# Patient Record
Sex: Female | Born: 1970 | Race: White | Hispanic: No | Marital: Married | Smoking: Never smoker
Health system: Southern US, Community
[De-identification: ages and names within clinical notes are randomized; demographics above are authoritative.]

## PROBLEM LIST (undated history)

## (undated) DIAGNOSIS — R42 Dizziness and giddiness: Secondary | ICD-10-CM

## (undated) DIAGNOSIS — I10 Essential (primary) hypertension: Secondary | ICD-10-CM

## (undated) HISTORY — PX: TONSILLECTOMY: SUR1361

## (undated) HISTORY — PX: ABDOMINAL HYSTERECTOMY: SHX81

## (undated) HISTORY — PX: CARPAL TUNNEL RELEASE: SHX101

## (undated) HISTORY — PX: CHOLECYSTECTOMY: SHX55

---

## 2005-11-09 ENCOUNTER — Ambulatory Visit (HOSPITAL_BASED_OUTPATIENT_CLINIC_OR_DEPARTMENT_OTHER): Admission: RE | Admit: 2005-11-09 | Discharge: 2005-11-09 | Payer: Self-pay | Admitting: Orthopedic Surgery

## 2006-03-21 ENCOUNTER — Ambulatory Visit (HOSPITAL_BASED_OUTPATIENT_CLINIC_OR_DEPARTMENT_OTHER): Admission: RE | Admit: 2006-03-21 | Discharge: 2006-03-21 | Payer: Self-pay | Admitting: Orthopedic Surgery

## 2007-02-15 ENCOUNTER — Emergency Department (HOSPITAL_COMMUNITY): Admission: EM | Admit: 2007-02-15 | Discharge: 2007-02-15 | Payer: Self-pay | Admitting: Emergency Medicine

## 2008-10-14 IMAGING — CR DG CHEST 2V
2 series · 2 of 2 positions shown · non-contrast
Comparison: No prior studies are available for comparison.

CLINICAL DATA: Chest pain. 
 CHEST - 2 VIEW:

[w chest pa]
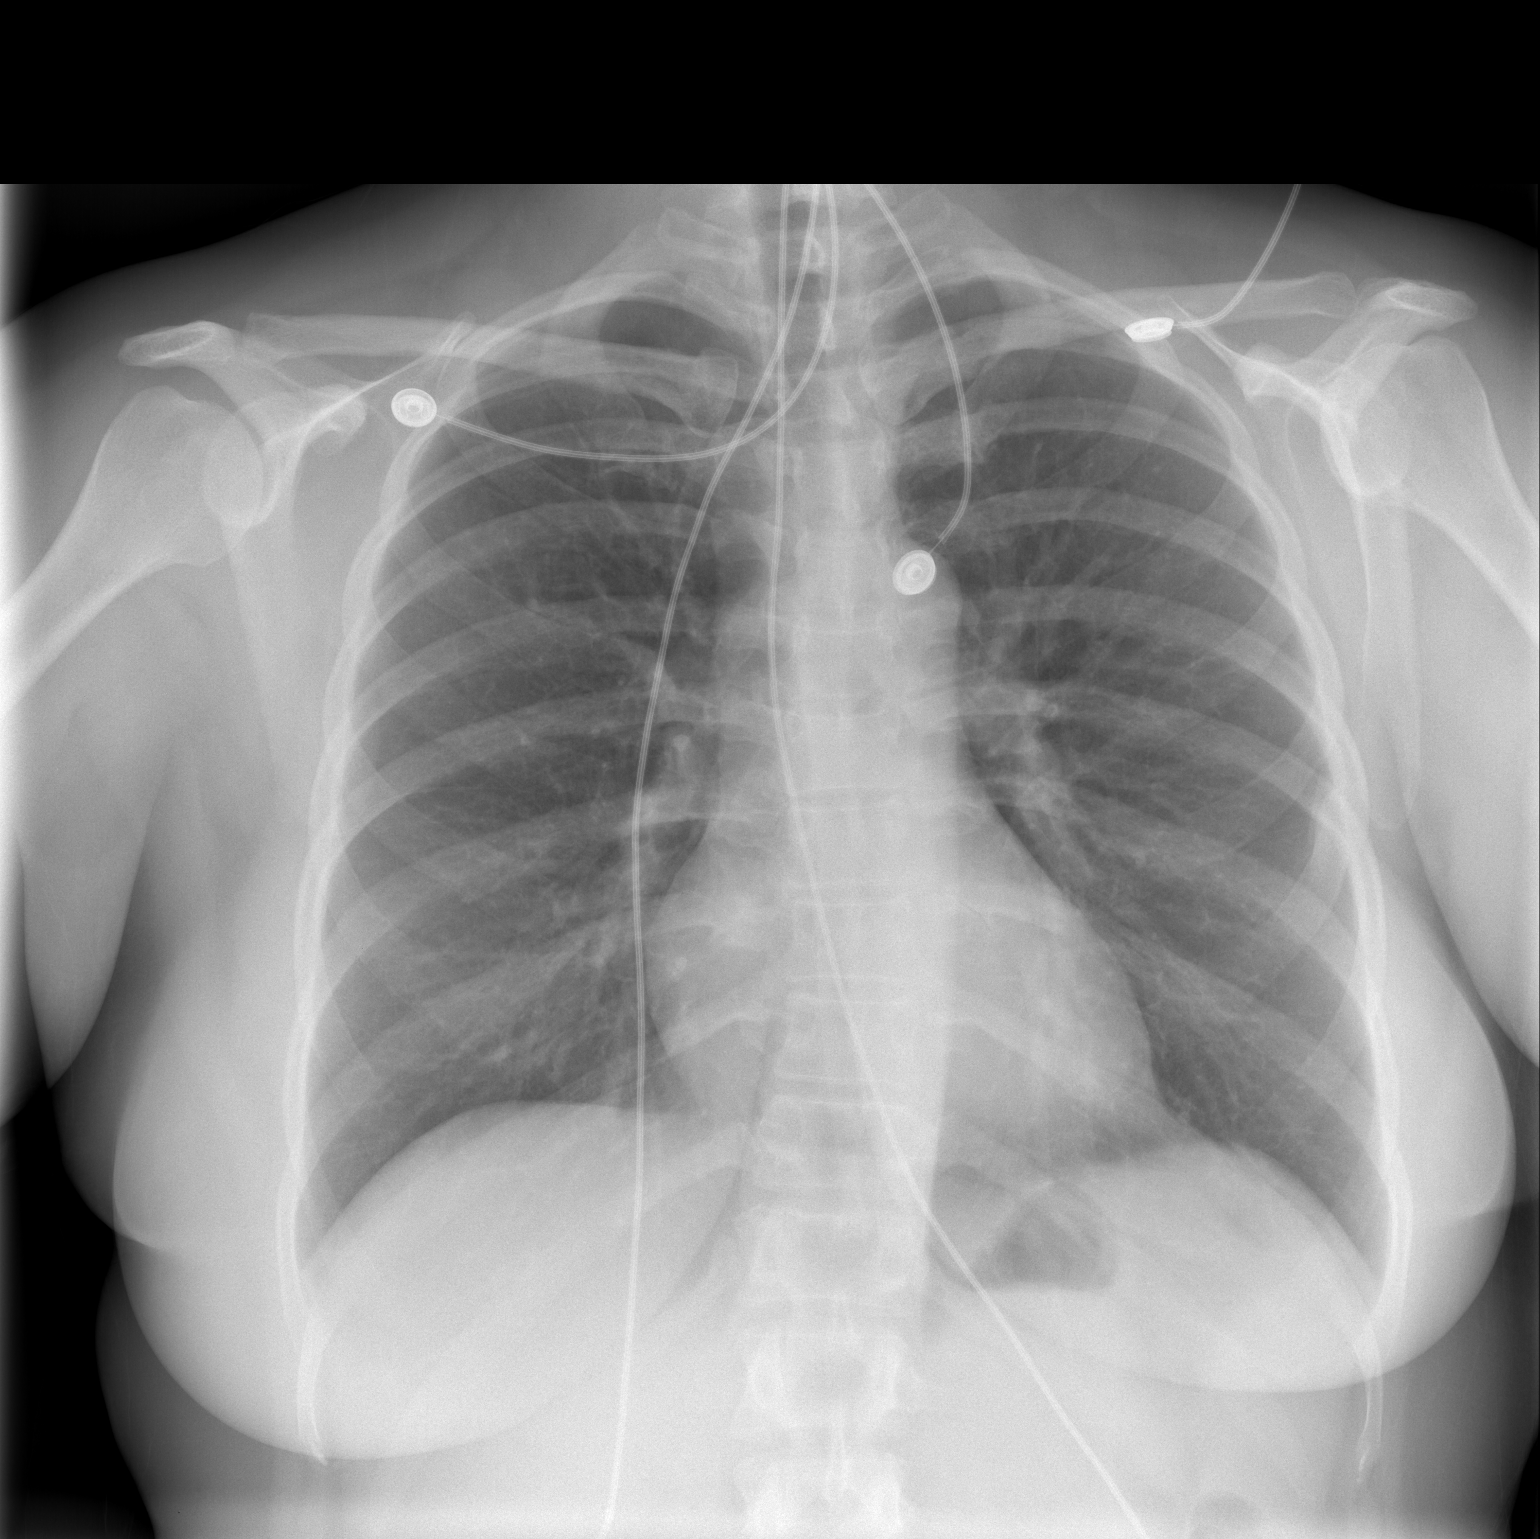

[w chest lat]
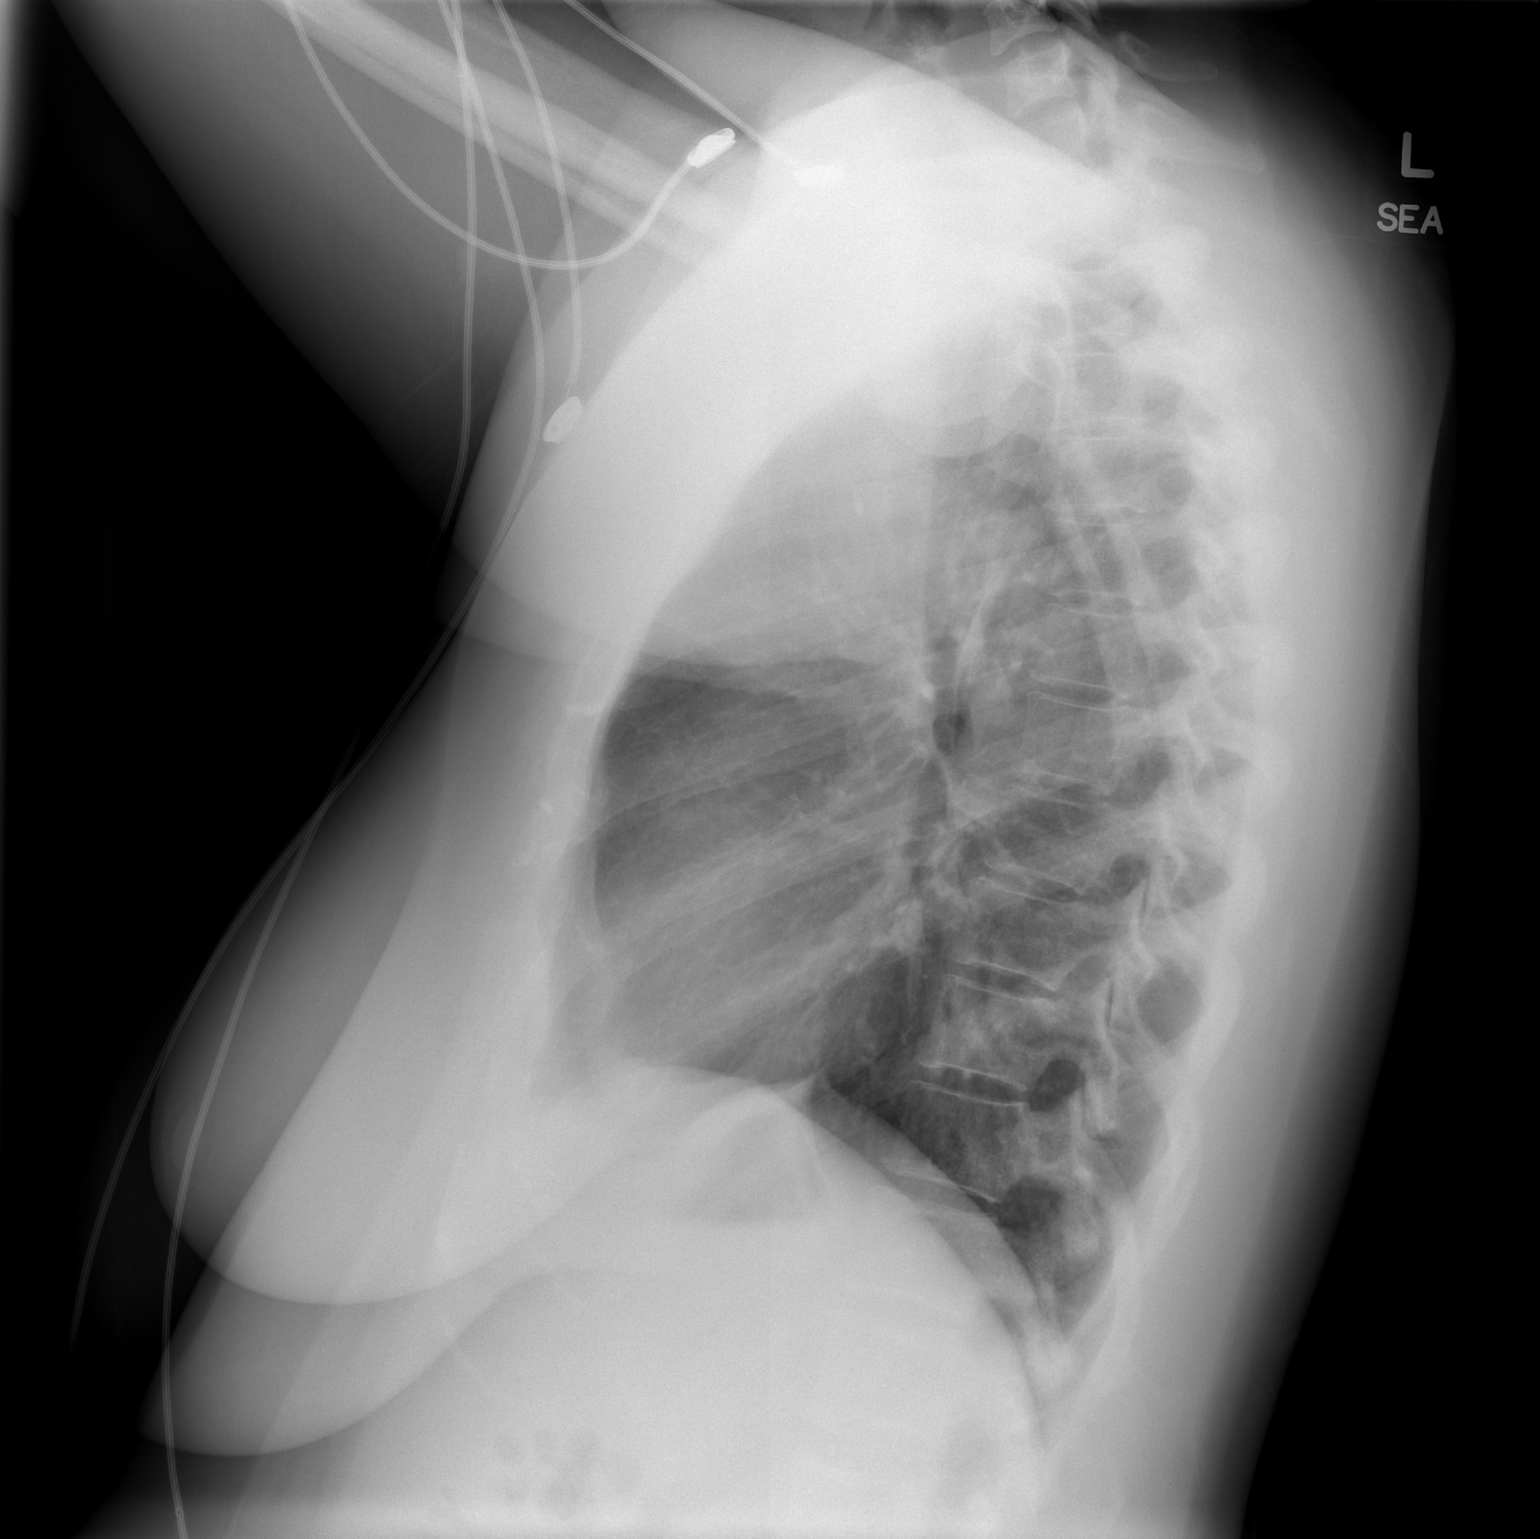

[2 of 2 positions shown; findings below may reference images not displayed]

FINDINGS: The lungs are clear. Remote posterior left sixth rib fracture. Cardiac and mediastinal contours normal. Osseous structures unremarkable.
IMPRESSION: Negative for acute cardiac or pulmonary process.

## 2010-08-18 NOTE — Op Note (Signed)
NAME:  Melanie Shaffer, Melanie Shaffer                ACCOUNT NO.:  [id number]   MEDICAL RECORD NO.:  [id number]          PATIENT TYPE:  AMB   LOCATION:  DSC                          FACILITY:  [facility]   PHYSICIAN:  Katy Fitch. Sypher, M.D. 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   POSTOPERATIVE DIAGNOSIS:  Chronic entrapment neuropathy left median  nerve at carpal tunnel.   OPERATION:  Release of left transverse carpal ligament.   OPERATING SURGEON:  Josephine Igo, M.D.   ASSISTANT:  Molly Maduro Dasnoit PA-C.   ANESTHESIA:  General by LMA.   SUPERVISING ANESTHESIOLOGIST:  Quita Skye. Krista Blue, M.D.   INDICATIONS:  [name] is a 40 year old woman referred for  evaluation and management of bilateral hand numbness.   Clinical examination revealed signs of bilateral carpal tunnel syndrome.  Electrodiagnostic confirmed significant bilateral carpal tunnel  syndrome.   Due to a failure to respond to nonoperative measures, she is brought to  the operating at this time for release of her left transverse carpal  ligament.   PROCEDURE:  Karlyn Glasco is brought to the operating room and placed in  the supine position upon the operating table.   Following the induction of general anesthesia by LMA, the left arm was  prepped with Betadine soap and solution and sterilely draped.  Following  exsanguination of the left arm with Esmarch bandage, the arterial  tourniquet on the proximal brachium is inflated to 220 mmHg.   Procedure commenced with a short incision in the line of the ring finger  and the palm.  Subcutaneous tissues were carefully divided revealing the  palmar fascia.  This was split longitudinally to reveal the common  sensory branch of the nerve.  These were followed back to the transverse  carpal ligament which was  gently isolated from median nerve.  The  ligament was then released along its ulnar border extending into the  distal forearm.  This widely opened the carpal canal.  No masses or  other predicaments were noted.   Bleeding points along the margin of the released ligament were  cauterized with bipolar current followed by repair of the skin with  intradermal 3-0 Prolene suture.   A compressive dressing was applied with a volar plaster splint  maintaining the wrist in 5 degrees of dorsiflexion.   There were no apparent complications.   Ms. Madonia tolerated surgery and anesthesia well.  She is transferred to  the recovery room with stable vital signs.     For aftercare, she is provided a prescription for Percocet 5 mg one  p.o. q.4-6h. p.r.n. pain 20 tablets without refill.      Katy Fitch Sypher, M.D.  Electronically Signed     RVS/MEDQ  D:  03/21/2006  T:  03/22/2006  Job:  [id number]

## 2010-08-18 NOTE — Op Note (Signed)
NAME:  Melanie Shaffer, Melanie Shaffer                ACCOUNT NO.:  [id number]   MEDICAL RECORD NO.:  [id number]          PATIENT TYPE:  AMB   LOCATION:  DSC                          FACILITY:  [facility]   PHYSICIAN:  Katy Fitch. Sypher, M.D. DATE OF BIRTH:  [birth year]   DATE OF PROCEDURE:  11/09/2005  DATE OF DISCHARGE:                                 OPERATIVE REPORT   PREOPERATIVE DIAGNOSIS:  Chronic right median entrapment neuropathy at  carpal tunnel.   POSTOPERATIVE DIAGNOSIS:  Chronic right median entrapment neuropathy at  carpal tunnel.   OPERATIONS:  Release of right transverse carpal ligament.   OPERATING SURGEON:  Josephine Igo, M.D.   ASSISTANT:  Annye Rusk, P.A.-C.   ANESTHESIA:  Is general by LMA; supervising anesthesiologist is Dr.  Gelene Mink.   INDICATIONS:  [name] is a 40 year old woman referred for evaluation  and management of hand numbness and pain.  Clinical examination revealed  signs of carpal tunnel syndrome.  Electrodiagnostic studies confirmed  significant median neuropathy.   Due to a failure to respond to nonoperative measures, she is brought to the  operating room at this time for release of her right transverse carpal  ligament.   PROCEDURE:  [name] was brought to the operating room and placed in  supine position on the operating table.   Following the induction general anesthesia by LMA technique, the right arm  was prepped with Betadine soap solution and sterilely draped.  On  examination of the limb with Esmarch bandage, arterial tourniquet inflated  to 220 mmHg.   Procedure commenced with a short incision in line of the ring finger into  the palm.  Subcutaneous tissue was carefully divided revealing the palmar  fascia.  This was split longitudinally to reveal the ___________  branch of  the median nerve.  This was followed back to transverse carpal ligament  which was gently isolated from the median nerve.  Ligament was released with  scissors  along its ulnar border, extending into the distal forearm.   This widely opened the carpal canal.  No mass or other predicaments were  noted.   Bleeding points along the margin of the released ligament were  electrocauterized with bipolar current followed by repair of the skin with  intradermal 3-0 Prolene suture.   A compressive dressing was applied with a volar plaster splint maintaining  the wrist in 5-degrees of dorsiflexion.      Katy Fitch Sypher, M.D.  Electronically Signed     RVS/MEDQ  D:  11/09/2005  T:  11/09/2005  Job:  [id number]

## 2011-01-09 LAB — DIFFERENTIAL
Basophils Absolute: 0
Basophils Relative: 1
Lymphocytes Relative: 27
Monocytes Absolute: 0.5
Monocytes Relative: 7

## 2011-01-09 LAB — CBC
MCHC: 35.2
RDW: 12.4
WBC: 6.5

## 2011-01-09 LAB — BASIC METABOLIC PANEL
BUN: 10
CO2: 31
Calcium: 9.3
Chloride: 105
Creatinine, Ser: 0.69
GFR calc Af Amer: >60
Sodium: 140

## 2011-01-09 LAB — POCT CARDIAC MARKERS
CKMB, poc: <1 — ABNORMAL LOW
Myoglobin, poc: 54
Operator id: 1211

## 2011-07-12 ENCOUNTER — Ambulatory Visit: Payer: Self-pay | Admitting: Internal Medicine

## 2011-08-23 ENCOUNTER — Ambulatory Visit: Payer: Self-pay | Admitting: Internal Medicine

## 2011-08-23 DIAGNOSIS — Z0289 Encounter for other administrative examinations: Secondary | ICD-10-CM

## 2012-03-05 ENCOUNTER — Emergency Department (HOSPITAL_COMMUNITY): Payer: BC Managed Care – PPO

## 2012-03-05 ENCOUNTER — Emergency Department (HOSPITAL_COMMUNITY)
Admission: EM | Admit: 2012-03-05 | Discharge: 2012-03-05 | Disposition: A | Discharge status: Home / Self Care | Payer: BC Managed Care – PPO | Attending: Emergency Medicine | Admitting: Emergency Medicine

## 2012-03-05 ENCOUNTER — Encounter (HOSPITAL_COMMUNITY): Payer: Self-pay | Admitting: Emergency Medicine

## 2012-03-05 DIAGNOSIS — I10 Essential (primary) hypertension: Secondary | ICD-10-CM | POA: Insufficient documentation

## 2012-03-05 DIAGNOSIS — Z7982 Long term (current) use of aspirin: Secondary | ICD-10-CM | POA: Insufficient documentation

## 2012-03-05 DIAGNOSIS — Z79899 Other long term (current) drug therapy: Secondary | ICD-10-CM | POA: Insufficient documentation

## 2012-03-05 DIAGNOSIS — H8110 Benign paroxysmal vertigo, unspecified ear: Secondary | ICD-10-CM | POA: Insufficient documentation

## 2012-03-05 DIAGNOSIS — R079 Chest pain, unspecified: Secondary | ICD-10-CM | POA: Insufficient documentation

## 2012-03-05 DIAGNOSIS — H9319 Tinnitus, unspecified ear: Secondary | ICD-10-CM | POA: Insufficient documentation

## 2012-03-05 DIAGNOSIS — R42 Dizziness and giddiness: Secondary | ICD-10-CM

## 2012-03-05 HISTORY — DX: Essential (primary) hypertension: I10

## 2012-03-05 LAB — CBC
HCT: 39 % (ref 36.0–46.0)
Hemoglobin: 13.2 g/dL (ref 12.0–15.0)
MCH: 28.7 pg (ref 26.0–34.0)
MCV: 84.8 fL (ref 78.0–100.0)
Platelets: 177 10*3/uL (ref 150–400)
RBC: 4.6 MIL/uL (ref 3.87–5.11)
RDW: 12 % (ref 11.5–15.5)
WBC: 10 10*3/uL (ref 4.0–10.5)

## 2012-03-05 LAB — BASIC METABOLIC PANEL
Calcium: 9.3 mg/dL (ref 8.4–10.5)
GFR calc Af Amer: >90 mL/min (ref >90)
Glucose, Bld: 106 mg/dL — ABNORMAL HIGH (ref 70–99)
Sodium: 134 mEq/L — ABNORMAL LOW (ref 135–145)

## 2012-03-05 MED ORDER — ONDANSETRON HCL 4 MG/2ML IJ SOLN
4.0000 mg | Freq: Once | INTRAMUSCULAR | Status: AC
  Administered 2012-03-05: 4 mg via INTRAVENOUS
  Filled 2012-03-05: qty 2

## 2012-03-05 MED ORDER — MECLIZINE HCL 50 MG PO TABS: 25.0000 mg | ORAL_TABLET | Freq: Three times a day (TID) | ORAL | Status: AC | PRN

## 2012-03-05 MED ORDER — METOCLOPRAMIDE HCL 5 MG/ML IJ SOLN
10.0000 mg | Freq: Once | INTRAMUSCULAR | Status: AC
  Administered 2012-03-05: 10 mg via INTRAVENOUS
  Filled 2012-03-05: qty 2

## 2012-03-05 MED ORDER — ONDANSETRON 8 MG PO TBDP: 8.0000 mg | ORAL_TABLET | Freq: Three times a day (TID) | ORAL | Status: AC | PRN

## 2012-03-05 MED ORDER — MECLIZINE HCL 25 MG PO TABS
50.0000 mg | ORAL_TABLET | Freq: Once | ORAL | Status: AC
  Administered 2012-03-05: 50 mg via ORAL
  Filled 2012-03-05: qty 2

## 2012-03-05 NOTE — ED Notes (Signed)
Pt states that she has been dizzy, nauseated and having "chest tightness" radiates to her back that comes and goes for couple of weeks. Pt states the chest tightness 3/10.

## 2012-03-05 NOTE — ED Notes (Signed)
Pt c/o feeling nauseated

## 2012-03-05 NOTE — ED Provider Notes (Signed)
History     CSN: [id number]  Arrival date & time 03/05/12  4098   First MD Initiated Contact with Patient 03/05/12 [phone]      Chief Complaint  Patient presents with  . Nausea  . Dizziness  . Chest Pain    (Consider location/radiation/quality/duration/timing/severity/associated sxs/prior treatment) HPI Comments: Pt comes in with cc of nausea, dizziness -described as vertigo. The vertigo is intermittent, and ranges between severe and moderate. She has some associated ringing in the ears and associated nausea, but no emesis. Symptoms are worse with certain positions, especially turning her head. Pt denies any other neurologic complains. No hx of brain cancer, aneurysms. No hx of vertigo. Chest pain - pt states that she had some pain earlier today, it was upper thoracic, and lasted only for few minutes. No cardiac hx.  Patient is a 41 y.o. female presenting with chest pain. The history is provided by the patient.  Chest Pain Pertinent negatives for primary symptoms include no shortness of breath, no palpitations, no abdominal pain, no nausea and no vomiting.     Past Medical History  Diagnosis Date  . Hypertension     Past Surgical History  Procedure Date  . Abdominal hysterectomy   . Tonsillectomy   . Cholecystectomy   . Carpal tunnel release     No family history on file.  History  Substance Use Topics  . Smoking status: Never Smoker   . Smokeless tobacco: Never Used  . Alcohol Use: No    OB History    Grav Para Term Preterm Abortions TAB SAB Ect Mult Living                  Review of Systems  Constitutional: Positive for activity change.  HENT: Positive for tinnitus. Negative for hearing loss, ear pain and neck pain.   Respiratory: Negative for shortness of breath.   Cardiovascular: Positive for chest pain. Negative for palpitations.  Gastrointestinal: Negative for nausea, vomiting and abdominal pain.  Genitourinary: Negative for dysuria.  Neurological:  Positive for light-headedness. Negative for headaches.    Allergies  Hydrocodone  Home Medications   Current Outpatient Rx  Name  Route  Sig  Dispense  Refill  . ASPIRIN 325 MG PO TABS   Oral   Take 325 mg by mouth daily.         Marland Kitchen VITAMIN B 12 PO   Oral   Take 1 tablet by mouth daily.         Marland Kitchen MECLIZINE HCL 50 MG PO TABS   Oral   Take 0.5 tablets (25 mg total) by mouth 3 (three) times daily as needed for dizziness.   30 tablet   0   . ONDANSETRON 8 MG PO TBDP   Oral   Take 1 tablet (8 mg total) by mouth every 8 (eight) hours as needed for nausea.   20 tablet   0     BP 148/81  Pulse 80  Temp 97.5 F (36.4 C) (Oral)  Resp 20  SpO2 99%  Physical Exam  Nursing note and vitals reviewed. Constitutional: She is oriented to person, place, and time. She appears well-developed.  HENT:  Head: Normocephalic and atraumatic.  Right Ear: External ear normal.  Left Ear: External ear normal.       When trying to attemp the dix halpike  - patient's sx got worse just with laying down. No nystagmus at any point.  Eyes: Conjunctivae normal and EOM are normal. Pupils are  equal, round, and reactive to light.       No nystagmus  Neck: Normal range of motion. Neck supple.  Cardiovascular: Normal rate, regular rhythm and normal heart sounds.   Pulmonary/Chest: Effort normal and breath sounds normal. No respiratory distress.  Abdominal: Soft. Bowel sounds are normal. She exhibits no distension. There is no tenderness. There is no rebound and no guarding.  Neurological: She is alert and oriented to person, place, and time.       Cerebellar exam is normal (finger to nose) Sensory exam normal for bilateral upper and lower extremities - and patient is able to discriminate between sharp and dull. Motor exam is 4+/5  Skin: Skin is warm and dry.    ED Course  Procedures (including critical care time)  Labs Reviewed  BASIC METABOLIC PANEL - Abnormal; Notable for the following:     Sodium 134 (*)     Glucose, Bld 106 (*)     All other components within normal limits  CBC  PRO B NATRIURETIC PEPTIDE  POCT I-STAT TROPONIN I   Dg Chest Port 1 View  03/05/2012  *RADIOLOGY REPORT*  Clinical Data: Nausea, dizziness, and chest pain  PORTABLE CHEST - 1 VIEW  Comparison: Chest radiograph 02/15/1999 at  Findings: The cardiac leads project over the chest.  The heart, mediastinal, hilar contours within normal limits.  The trachea is midline.  The lungs are clear.  No acute bony abnormality.  IMPRESSION: No acute cardiopulmonary disease.   Original Report Authenticated By: Britta Mccreedy, M.D.      1. BPPV (benign paroxysmal positional vertigo)   2. Vertigo       MDM  DDx includes:  Peripheral Vertigo:  BPPV  Vestibular neuritis  Meniere disease  Migrainous vertigo  Ear Infection    Date: 03/05/2012  Rate: 77  Rhythm: normal sinus rhythm  QRS Axis: normal  Intervals: normal  ST/T Wave abnormalities: normal  Conduction Disutrbances: none  Narrative Interpretation: unremarkable  Pt comes in with cc of vertigo. Pt's hx and exam suggestive of peripheral cause. She is having bilateral ringing in the ear - but her exam is normal. We will give her meclizine, reglan and hydrate. Basic labs and troponin ordered as well.  If the workup is normal, ENT f/u will be provided. Meniere's is a possible etiology.     Derwood Kaplan, MD 03/05/12 [phone]

## 2012-03-05 NOTE — ED Notes (Signed)
Pt c/o of her ears being "stopped up feeling". Pt denies any pain.

## 2012-03-05 NOTE — ED Notes (Signed)
MDP Melanie Shaffer made aware of pt nauseated.

## 2012-03-05 NOTE — ED Notes (Addendum)
Pt c/o of "feeling weird, and very agitated. States she getting hot. EDP Nanavati came in room and advised of pt's complaint. Verbally advised to run NS via IV.

## 2013-11-02 IMAGING — CR DG CHEST 1V PORT
1 series · 1 of 1 positions shown · non-contrast
Comparison: Chest radiograph 02/15/1999 at

CLINICAL DATA: Nausea, dizziness, and chest pain

PORTABLE CHEST - 1 VIEW

[AP]
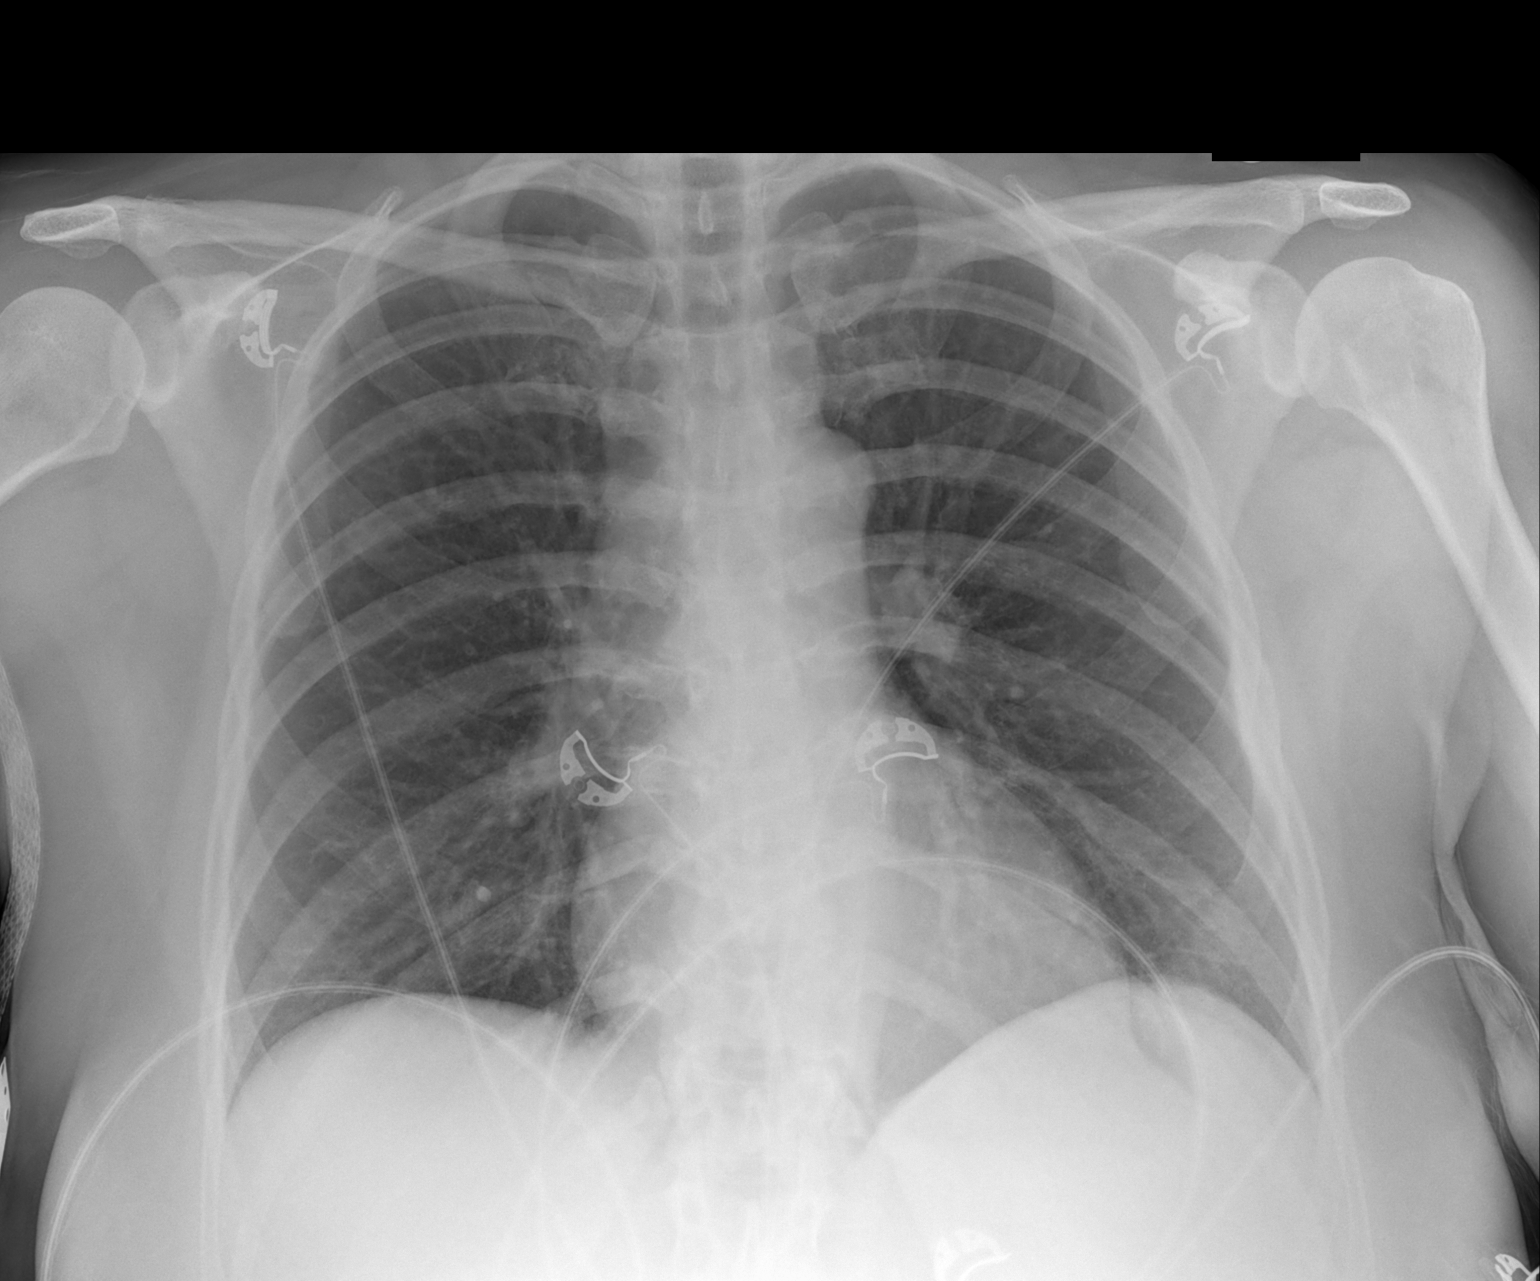

[1 of 1 positions shown; findings below may reference images not displayed]

FINDINGS: The cardiac leads project over the chest.  The heart,
mediastinal, hilar contours within normal limits.  The trachea is
midline.  The lungs are clear.  No acute bony abnormality.
IMPRESSION: No acute cardiopulmonary disease.

## 2013-12-26 DIAGNOSIS — K219 Gastro-esophageal reflux disease without esophagitis: Secondary | ICD-10-CM | POA: Insufficient documentation

## 2013-12-26 DIAGNOSIS — J309 Allergic rhinitis, unspecified: Secondary | ICD-10-CM | POA: Insufficient documentation

## 2013-12-26 DIAGNOSIS — B9689 Other specified bacterial agents as the cause of diseases classified elsewhere: Secondary | ICD-10-CM | POA: Insufficient documentation

## 2013-12-26 DIAGNOSIS — R079 Chest pain, unspecified: Secondary | ICD-10-CM | POA: Insufficient documentation

## 2013-12-26 DIAGNOSIS — I10 Essential (primary) hypertension: Secondary | ICD-10-CM | POA: Insufficient documentation

## 2018-08-29 DIAGNOSIS — M25562 Pain in left knee: Secondary | ICD-10-CM | POA: Insufficient documentation

## 2019-03-16 ENCOUNTER — Other Ambulatory Visit: Payer: Self-pay

## 2019-03-16 NOTE — Telephone Encounter (Signed)
NOTES ON FILE FROM DR Cecille Amsterdam [phone] SENT REFERRAL TO [facility]

## 2019-08-25 ENCOUNTER — Emergency Department (HOSPITAL_COMMUNITY): Payer: Commercial Managed Care - PPO

## 2019-08-25 ENCOUNTER — Encounter (HOSPITAL_COMMUNITY): Payer: Self-pay

## 2019-08-25 ENCOUNTER — Other Ambulatory Visit: Payer: Self-pay

## 2019-08-25 ENCOUNTER — Emergency Department (HOSPITAL_COMMUNITY)
Admission: EM | Admit: 2019-08-25 | Discharge: 2019-08-25 | Disposition: A | Discharge status: Home / Self Care | Payer: Commercial Managed Care - PPO | Attending: Emergency Medicine | Admitting: Emergency Medicine

## 2019-08-25 DIAGNOSIS — Z7982 Long term (current) use of aspirin: Secondary | ICD-10-CM | POA: Diagnosis not present

## 2019-08-25 DIAGNOSIS — Z79899 Other long term (current) drug therapy: Secondary | ICD-10-CM | POA: Insufficient documentation

## 2019-08-25 DIAGNOSIS — R42 Dizziness and giddiness: Secondary | ICD-10-CM | POA: Diagnosis not present

## 2019-08-25 DIAGNOSIS — R0789 Other chest pain: Secondary | ICD-10-CM | POA: Diagnosis present

## 2019-08-25 DIAGNOSIS — M79662 Pain in left lower leg: Secondary | ICD-10-CM | POA: Diagnosis not present

## 2019-08-25 DIAGNOSIS — R002 Palpitations: Secondary | ICD-10-CM | POA: Insufficient documentation

## 2019-08-25 HISTORY — DX: Dizziness and giddiness: R42

## 2019-08-25 LAB — CBC
HCT: 41 % (ref 36.0–46.0)
Hemoglobin: 13.1 g/dL (ref 12.0–15.0)
MCH: 28.2 pg (ref 26.0–34.0)
MCHC: 32 g/dL (ref 30.0–36.0)
MCV: 88.2 fL (ref 80.0–100.0)
Platelets: 182 10*3/uL (ref 150–400)
RBC: 4.65 MIL/uL (ref 3.87–5.11)
RDW: 12.7 % (ref 11.5–15.5)
WBC: 6.6 10*3/uL (ref 4.0–10.5)
nRBC: 0 % (ref 0.0–0.2)

## 2019-08-25 LAB — BASIC METABOLIC PANEL
Anion gap: 11 (ref 5–15)
BUN: 14 mg/dL (ref 6–20)
CO2: 25 mmol/L (ref 22–32)
Calcium: 9.4 mg/dL (ref 8.9–10.3)
Chloride: 105 mmol/L (ref 98–111)
Creatinine, Ser: 0.84 mg/dL (ref 0.44–1.00)
GFR calc Af Amer: >60 mL/min (ref >60)
GFR calc non Af Amer: >60 mL/min (ref >60)
Glucose, Bld: 139 mg/dL — ABNORMAL HIGH (ref 70–99)
Potassium: 3.7 mmol/L (ref 3.5–5.1)
Sodium: 141 mmol/L (ref 135–145)

## 2019-08-25 LAB — TROPONIN I (HIGH SENSITIVITY): Troponin I (High Sensitivity): 3 ng/L (ref <18)

## 2019-08-25 NOTE — ED Provider Notes (Signed)
[facility] EMERGENCY DEPARTMENT Provider Note   CSN: [id number] Arrival date & time: 08/25/19  4431     History Chief Complaint  Patient presents with  . Chest Pain  . Dizziness    Melanie Shaffer is a 49 y.o. female.  Melanie Shaffer is a 49 year old female with a history of vertigo for which she takes meclizine and high blood pressure for which she takes atenolol --presents today by EMS after an episode of vertigo, palpitations, and chest tightness.  Patient states that she awoke this morning with her typical vertigo symptoms described as a spinning sensation.  She states that she got up and went to the kitchen.  Her husband at bedside states that this episode was worse than normal.  Patient states that she became very anxious and her heart started to race.  This was associated with chest tightness.  She also had a headache.  EMS was called.  Patient took aspirin prior to arrival.  By EMS report, blood pressure was initially 228/120.  Patient did not take her atenolol this morning.  She was transported to the hospital and began to feel better.  She was waiting in the waiting room with minimal symptoms.  Currently she only reports mild dizziness at this time.  Chest tightness only occurred with heart racing.  Patient denies signs of stroke including: facial droop, slurred speech, aphasia, weakness/numbness in extremities, imbalance/trouble walking.  Patient denies risk factors for pulmonary embolism including: unilateral leg swelling, history of DVT/PE/other blood clots, use of exogenous hormones, recent immobilizations, recent surgery, malignancy, hemoptysis.  Patient does report a 4-hour car ride last week during which they took 1 short break.  Patient states that she went up to use the bathroom in the waiting room of the emergency department and noted that she had some left calf cramping and tenderness.  This was not present prior to ED arrival.         Past Medical History:   Diagnosis Date  . Hypertension   . Vertigo     There are no problems to display for this patient.   Past Surgical History:  Procedure Laterality Date  . ABDOMINAL HYSTERECTOMY    . CARPAL TUNNEL RELEASE    . CHOLECYSTECTOMY    . TONSILLECTOMY       OB History   No obstetric history on file.     No family history on file.  Social History   Tobacco Use  . Smoking status: Never Smoker  . Smokeless tobacco: Never Used  Substance Use Topics  . Alcohol use: No  . Drug use: No    Home Medications Prior to Admission medications   Medication Sig Start Date End Date Taking? Authorizing Provider  aspirin 325 MG tablet Take 325 mg by mouth daily.    [provider]  Cyanocobalamin (VITAMIN B 12 PO) Take 1 tablet by mouth daily.    [provider]  meclizine (ANTIVERT) 50 MG tablet Take 0.5 tablets (25 mg total) by mouth 3 (three) times daily as needed for dizziness. 03/05/12   Varney Biles, MD  ondansetron (ZOFRAN ODT) 8 MG disintegrating tablet Take 1 tablet (8 mg total) by mouth every 8 (eight) hours as needed for nausea. 03/05/12   Varney Biles, MD    Allergies    Hydrocodone  Review of Systems   Review of Systems  Constitutional: Negative for diaphoresis and fever.  Eyes: Negative for redness.  Respiratory: Positive for chest tightness. Negative for cough and  shortness of breath.   Cardiovascular: Positive for chest pain. Negative for palpitations and leg swelling.  Gastrointestinal: Negative for abdominal pain, nausea and vomiting.  Genitourinary: Negative for dysuria.  Musculoskeletal: Positive for myalgias. Negative for back pain and neck pain.  Skin: Negative for rash.  Neurological: Positive for dizziness and headaches. Negative for syncope, facial asymmetry, weakness, light-headedness and numbness.  Psychiatric/Behavioral: The patient is not nervous/anxious.     Physical Exam Updated Vital Signs BP (!) 144/93 (BP Location: Left Arm)    Pulse 70   Temp 98.2 F (36.8 C) (Oral)   Resp 20   SpO2 99%   Physical Exam Vitals and nursing note reviewed.  Constitutional:      Appearance: She is well-developed.  HENT:     Head: Normocephalic and atraumatic.     Right Ear: Tympanic membrane, ear canal and external ear normal.     Left Ear: Tympanic membrane, ear canal and external ear normal.     Nose: Nose normal.     Mouth/Throat:     Pharynx: Uvula midline.  Eyes:     General: Lids are normal.        Right eye: No discharge.        Left eye: No discharge.     Extraocular Movements:     Right eye: No nystagmus.     Left eye: No nystagmus.     Conjunctiva/sclera: Conjunctivae normal.     Pupils: Pupils are equal, round, and reactive to light.  Cardiovascular:     Rate and Rhythm: Normal rate and regular rhythm.     Heart sounds: Normal heart sounds.  Pulmonary:     Effort: Pulmonary effort is normal.     Breath sounds: Normal breath sounds.  Abdominal:     Palpations: Abdomen is soft.     Tenderness: There is no abdominal tenderness.  Musculoskeletal:     Cervical back: Normal range of motion and neck supple. No tenderness or bony tenderness.     Right lower leg: No tenderness. No edema.     Left lower leg: Tenderness present. No edema.     Comments: Patient reports some mild left calf tenderness which started after ED arrival.  She does not have any gross edema, erythema.  Normal 2+ DP pulses bilaterally.  No clinical signs of DVT.  Skin:    General: Skin is warm and dry.  Neurological:     Mental Status: She is alert and oriented to person, place, and time.     GCS: GCS eye subscore is 4. GCS verbal subscore is 5. GCS motor subscore is 6.     Cranial Nerves: No cranial nerve deficit.     Sensory: No sensory deficit.     Coordination: Coordination normal.     Gait: Gait normal.     ED Results / Procedures / Treatments   Labs (all labs ordered are listed, but only abnormal results are displayed) Labs  Reviewed  BASIC METABOLIC PANEL - Abnormal; Notable for the following components:      Result Value   Glucose, Bld 139 (*)    All other components within normal limits  CBC  TROPONIN I (HIGH SENSITIVITY)  TROPONIN I (HIGH SENSITIVITY)   ED ECG REPORT   Date: 08/25/2019  Rate: 87  Rhythm: normal sinus rhythm  QRS Axis: normal  Intervals: normal  ST/T Wave abnormalities: normal  Conduction Disutrbances:none  Narrative Interpretation: no significant ST changes noted  Old EKG Reviewed: unchanged from  2013  I have personally reviewed the EKG tracing and disagree with the computerized printout as noted.  Radiology DG Chest 2 View  Result Date: 08/25/2019 CLINICAL DATA:  dizziness and chest pain/tightness that started this morning when she woke up, pain is intermittent in the center of her chest. Pt also having a headache that radiates down her neck. EXAM: CHEST - 2 VIEW COMPARISON:  03/12/2019 no FINDINGS: The heart size and mediastinal contours are within normal limits. Both lungs are clear. Pleural effusion or pneumothorax. The visualized skeletal structures are unremarkable. IMPRESSION: No active cardiopulmonary disease. Electronically Signed   By: Amie Portland M.D.   On: 08/25/2019 08:33    Procedures Procedures (including critical care time)  Medications Ordered in ED Medications - No data to display  ED Course  I have reviewed the triage vital signs and the nursing notes.  Pertinent labs & imaging results that were available during my care of the patient were reviewed by me and considered in my medical decision making (see chart for details).  Patient seen and examined. Work-up reviewed.  Upon arrival to the exam room, patient has been waiting in the emergency department for greater than 6 and half hours.  History and physical performed.  Additional history per husband at bedside who is very helpful.  At this point, patient has minimal symptoms.  I discussed further work-up  that is recommended at this time.  This includes second troponin and EKG.  We discussed that she came in very shortly after her symptoms began and the first troponin may not be elevated if she had a cardiac event.  Discussed that this would likely take 45 minutes to an hour after blood draw to result.  Unfortunately, this was not performed while the patient was in the waiting room.  Patient also has some left calf tenderness.  Overall, discussed with patient and husband, that her exam and history is not concerning for PE or DVT.  She does have recent travel.  I would have expected the patient symptoms be persistent, for her to have tachycardia (no beta-blocker this morning), and hypoxia.  Her symptoms seem to have resolved which makes PE less likely.  I have discussed and offered ultrasound to rule out DVT.  Patient verbalizes that she is feeling a lot better.  She was actually thinking about leaving while in the waiting room.  At this point she declines any further testing.  She seems to understand the benefits of additional testing and risks of missing potential cardiac event/blood clot -- although admittedly I feel the chance of her having 1 of these things is very low.  Patient does state that she is willing to return to the emergency department if she has any recurrent or persistent symptoms, symptoms different than what she typically experiences, or any other concerns or if she changes her mind about further evaluation.  Patient will be discharged home.  Vital signs reviewed and are as follows: BP (!) 144/93 (BP Location: Left Arm)   Pulse 70   Temp 98.2 F (36.8 C) (Oral)   Resp 20   SpO2 99%      MDM Rules/Calculators/A&P                      Patient with no past cardiac or arrhythmic history --presents with an episode of palpitations and chest tightness, hypertension, in setting of anxiety and vertigo.  Patient does not have any signs and symptoms of stroke.  Her  symptoms have gradually  resolved and improved.  Risk factors for ACS include age, HTN, BMI.  EKG is reassuring.  Troponin negative x1.  Chest x-ray is clear.  She is neurologically intact.  She looks very well and is feeling better.  Offered repeat troponin, EKG and ultrasound of the leg in which she developed some pain after arrival to the ED.  She declines due to extended wait time in ED and clinical improvement.  Overall I feel the risk of ACS or PE in this patient is very low.  She seems reliable to return if symptoms worsen.  She has been in the emergency department for almost 7 hours now and is requesting discharge.  She looks well at time of discharge.  Husband at bedside agrees with plan.    Final Clinical Impression(s) / ED Diagnoses Final diagnoses:  Chest tightness  Palpitations  Vertigo    Rx / DC Orders ED Discharge Orders    None       Renne Crigler, PA-C 08/25/19 1448    Geoffery Lyons, MD 08/27/19 [phone]

## 2019-08-25 NOTE — Discharge Instructions (Signed)
Please read and follow all provided instructions.  Your diagnoses today include:  1. Chest tightness   2. Palpitations   3. Vertigo     Tests performed today include:  An EKG of your heart -does not show any signs of stress on the heart  A chest x-ray -is clear without any pneumonias or other problems  Cardiac enzymes - a blood test for heart muscle damage --the first test ordered was negative, typically would check a second test to ensure that this is not going up  Blood counts and electrolytes  Vital signs. See below for your results today.   Medications prescribed:   None  Take any prescribed medications only as directed.  Follow-up instructions: Please follow-up with your primary care provider as soon as you can for further evaluation of your symptoms.   Return instructions:  SEEK IMMEDIATE MEDICAL ATTENTION IF:  You have severe chest pain, especially if the pain is crushing or pressure-like and spreads to the arms, back, neck, or jaw, or if you have sweating, nausea (feeling sick to your stomach), or shortness of breath. THIS IS AN EMERGENCY. Don't wait to see if the pain will go away. Get medical help at once. Call 911 or [phone] (operator). DO NOT drive yourself to the hospital.   Your chest pain gets worse and does not go away with rest.   You have an attack of chest pain lasting longer than usual, despite rest and treatment with the medications your caregiver has prescribed.   You wake from sleep with chest pain or shortness of breath.  You feel dizzy or faint.  You have chest pain not typical of your usual pain for which you originally saw your caregiver.   You have any other emergent concerns regarding your health.  Additional Information: Chest pain comes from many different causes. Your caregiver has diagnosed you as having chest pain that is not specific for one problem, but does not require admission.  You are at low risk for an acute heart condition or other  serious illness.   Your vital signs today were: BP (!) 144/93 (BP Location: Left Arm)   Pulse 70   Temp 98.2 F (36.8 C) (Oral)   Resp 20   SpO2 99%  If your blood pressure (BP) was elevated above 135/85 this visit, please have this repeated by your doctor within one month. --------------

## 2019-08-25 NOTE — ED Notes (Signed)
Patient given discharge instructions patient verbalizes understanding. 

## 2019-08-25 NOTE — ED Triage Notes (Signed)
Pt c.o dizziness and chest pain that started this morning when she woke up, pain is intermittent in the center of her chest. Pt also having a headache that radiates down her neck. EMS reports some anxiety en route. Pt took 6 81mg  ASA prior to EMS arrival. Pt takes meclizine at home, did not take any this morning. Felt nauseous en route, pt given 4mg  Iv zofran, 20G RAC  BP 144/80, initially 228/120 HR 99 100% room air CBG 109

## 2020-03-10 ENCOUNTER — Ambulatory Visit: Payer: Commercial Managed Care - PPO | Admitting: Podiatry

## 2020-03-11 ENCOUNTER — Ambulatory Visit (INDEPENDENT_AMBULATORY_CARE_PROVIDER_SITE_OTHER): Payer: Commercial Managed Care - PPO

## 2020-03-11 ENCOUNTER — Ambulatory Visit (INDEPENDENT_AMBULATORY_CARE_PROVIDER_SITE_OTHER): Payer: Commercial Managed Care - PPO | Admitting: Podiatry

## 2020-03-11 ENCOUNTER — Other Ambulatory Visit: Payer: Self-pay

## 2020-03-11 DIAGNOSIS — M7671 Peroneal tendinitis, right leg: Secondary | ICD-10-CM | POA: Diagnosis not present

## 2020-03-11 DIAGNOSIS — S9031XA Contusion of right foot, initial encounter: Secondary | ICD-10-CM

## 2020-03-11 MED ORDER — TRIAMCINOLONE ACETONIDE 10 MG/ML IJ SUSP
10.0000 mg | Freq: Once | INTRAMUSCULAR | Status: AC
  Administered 2020-03-11: 10 mg

## 2020-03-11 MED ORDER — DICLOFENAC SODIUM 75 MG PO TBEC
75.0000 mg | DELAYED_RELEASE_TABLET | Freq: Two times a day (BID) | ORAL | 2 refills | Status: AC
Start: 2020-03-11 — End: ?

## 2020-03-12 NOTE — Progress Notes (Signed)
Subjective:   Patient ID: Melanie Shaffer, female   DOB: 49 y.o.   MRN: [id number]   HPI Patient states she has developed a lot of pain on top of her right foot and states it has been worse recently.  States that it is very sore when she tries to do a lot of walking and she does not remember injury.  Patient does not smoke likes to be active   Review of Systems  All other systems reviewed and are negative.       Objective:  Physical Exam Vitals and nursing note reviewed.  Constitutional:      Appearance: She is well-developed and well-nourished.  Cardiovascular:     Pulses: Intact distal pulses.  Pulmonary:     Effort: Pulmonary effort is normal.  Musculoskeletal:        General: Normal range of motion.  Skin:    General: Skin is warm.  Neurological:     Mental Status: She is alert.     Neurovascular status intact muscle strength adequate range of motion within normal limits.  Patient is found to have exquisite discomfort on the lateral side of the right foot around the peroneal tendon and into the peroneal tarsus complex.  There is good digital perfusion well oriented x3     Assessment:  Acute peroneal tendinitis right with inflammation     Plan:  H&P x-ray reviewed today I injected the peroneal tarsus longus 3 mg Kenalog 5 mg Xylocaine advised on ice therapy diclofenac gel and applied fascial brace to lift up the lateral side of the foot.  Reappoint to recheck in the next several weeks  X-rays indicate that there is no signs of stress fracture or arthritis associated with the trauma

## 2020-03-28 ENCOUNTER — Ambulatory Visit: Payer: Commercial Managed Care - PPO | Admitting: Podiatry

## 2020-12-16 ENCOUNTER — Other Ambulatory Visit: Payer: Self-pay

## 2020-12-16 ENCOUNTER — Ambulatory Visit (INDEPENDENT_AMBULATORY_CARE_PROVIDER_SITE_OTHER): Payer: Commercial Managed Care - PPO | Admitting: Podiatry

## 2020-12-16 ENCOUNTER — Encounter: Payer: Self-pay | Admitting: Podiatry

## 2020-12-16 ENCOUNTER — Other Ambulatory Visit: Payer: Self-pay | Admitting: Podiatry

## 2020-12-16 ENCOUNTER — Ambulatory Visit (INDEPENDENT_AMBULATORY_CARE_PROVIDER_SITE_OTHER): Payer: Commercial Managed Care - PPO

## 2020-12-16 DIAGNOSIS — M7671 Peroneal tendinitis, right leg: Secondary | ICD-10-CM

## 2020-12-16 DIAGNOSIS — M79671 Pain in right foot: Secondary | ICD-10-CM

## 2020-12-16 MED ORDER — TRIAMCINOLONE ACETONIDE 10 MG/ML IJ SUSP
10.0000 mg | Freq: Once | INTRAMUSCULAR | Status: AC
  Administered 2020-12-16: 10 mg

## 2020-12-16 NOTE — Progress Notes (Signed)
Subjective:   Patient ID: Melanie Shaffer, female   DOB: 50 y.o.   MRN: [id number]   HPI Patient presents stating she was improving right foot for about 6 months and over the last couple months has developed increased discomfort on the outside of the foot slightly different area than previous but present   ROS      Objective:  Physical Exam  Neurovascular status intact with inflammation occurring of the lateral side of the foot more distal than it was previously with inflammation fluid noted and no muscle loss     Assessment:  Inflammatory condition with what appears to be peroneal extensor tendinitis     Plan:  H&P reviewed condition and went ahead and did sterile prep and injected the extensor and peroneal tendon group.  X-rays reviewed with patient at the current time x-rays were negative for signs of fracture or bony pathology appears to be soft tissue

## 2021-04-23 IMAGING — CR DG CHEST 2V
2 series · 2 of 2 positions shown · non-contrast
Comparison: 03/12/2019 no

CLINICAL DATA: dizziness and chest pain/tightness that started this
morning when she woke up, pain is intermittent in the center of her
chest. Pt also having a headache that radiates down her neck.

EXAM:
CHEST - 2 VIEW

[chest pa]
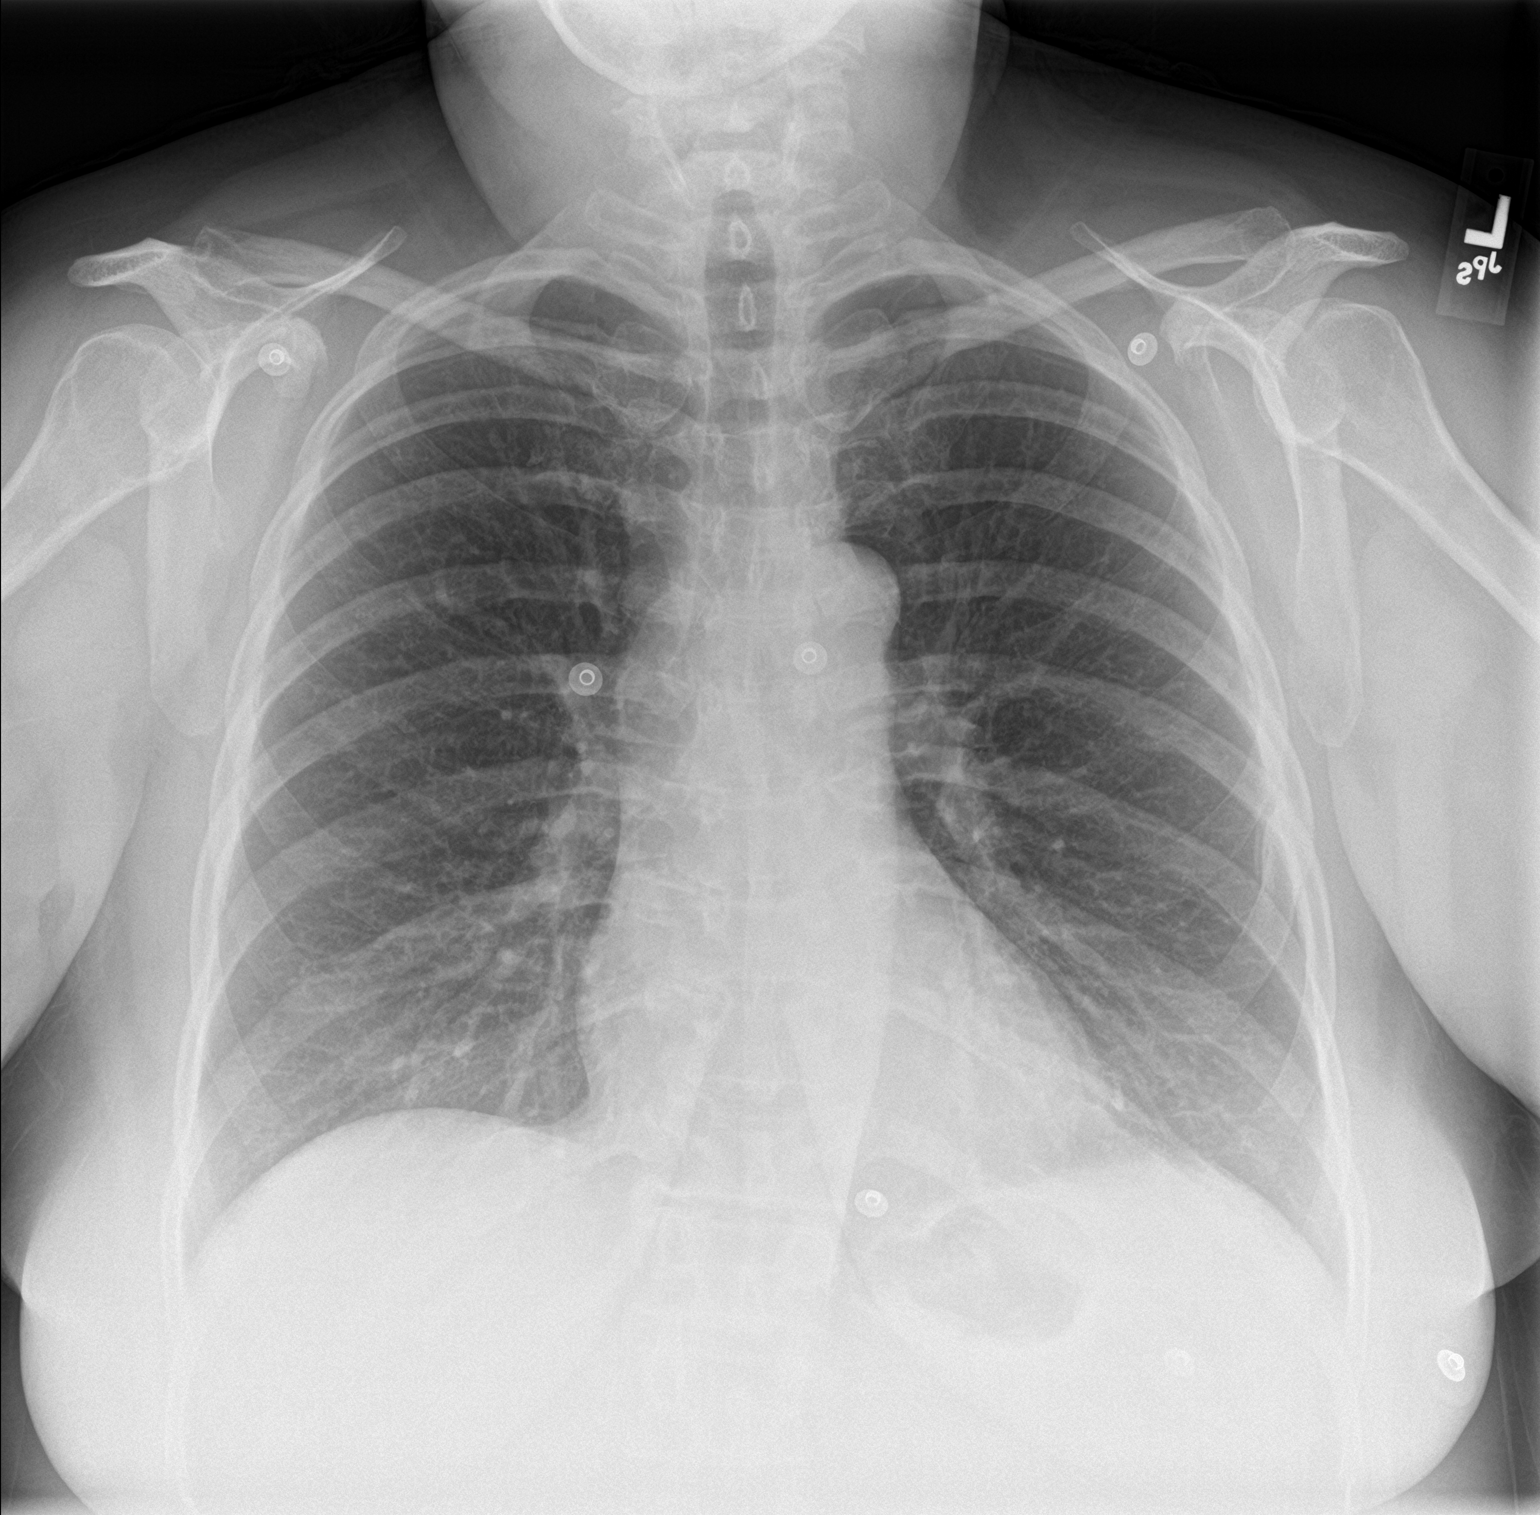

[chest lat]
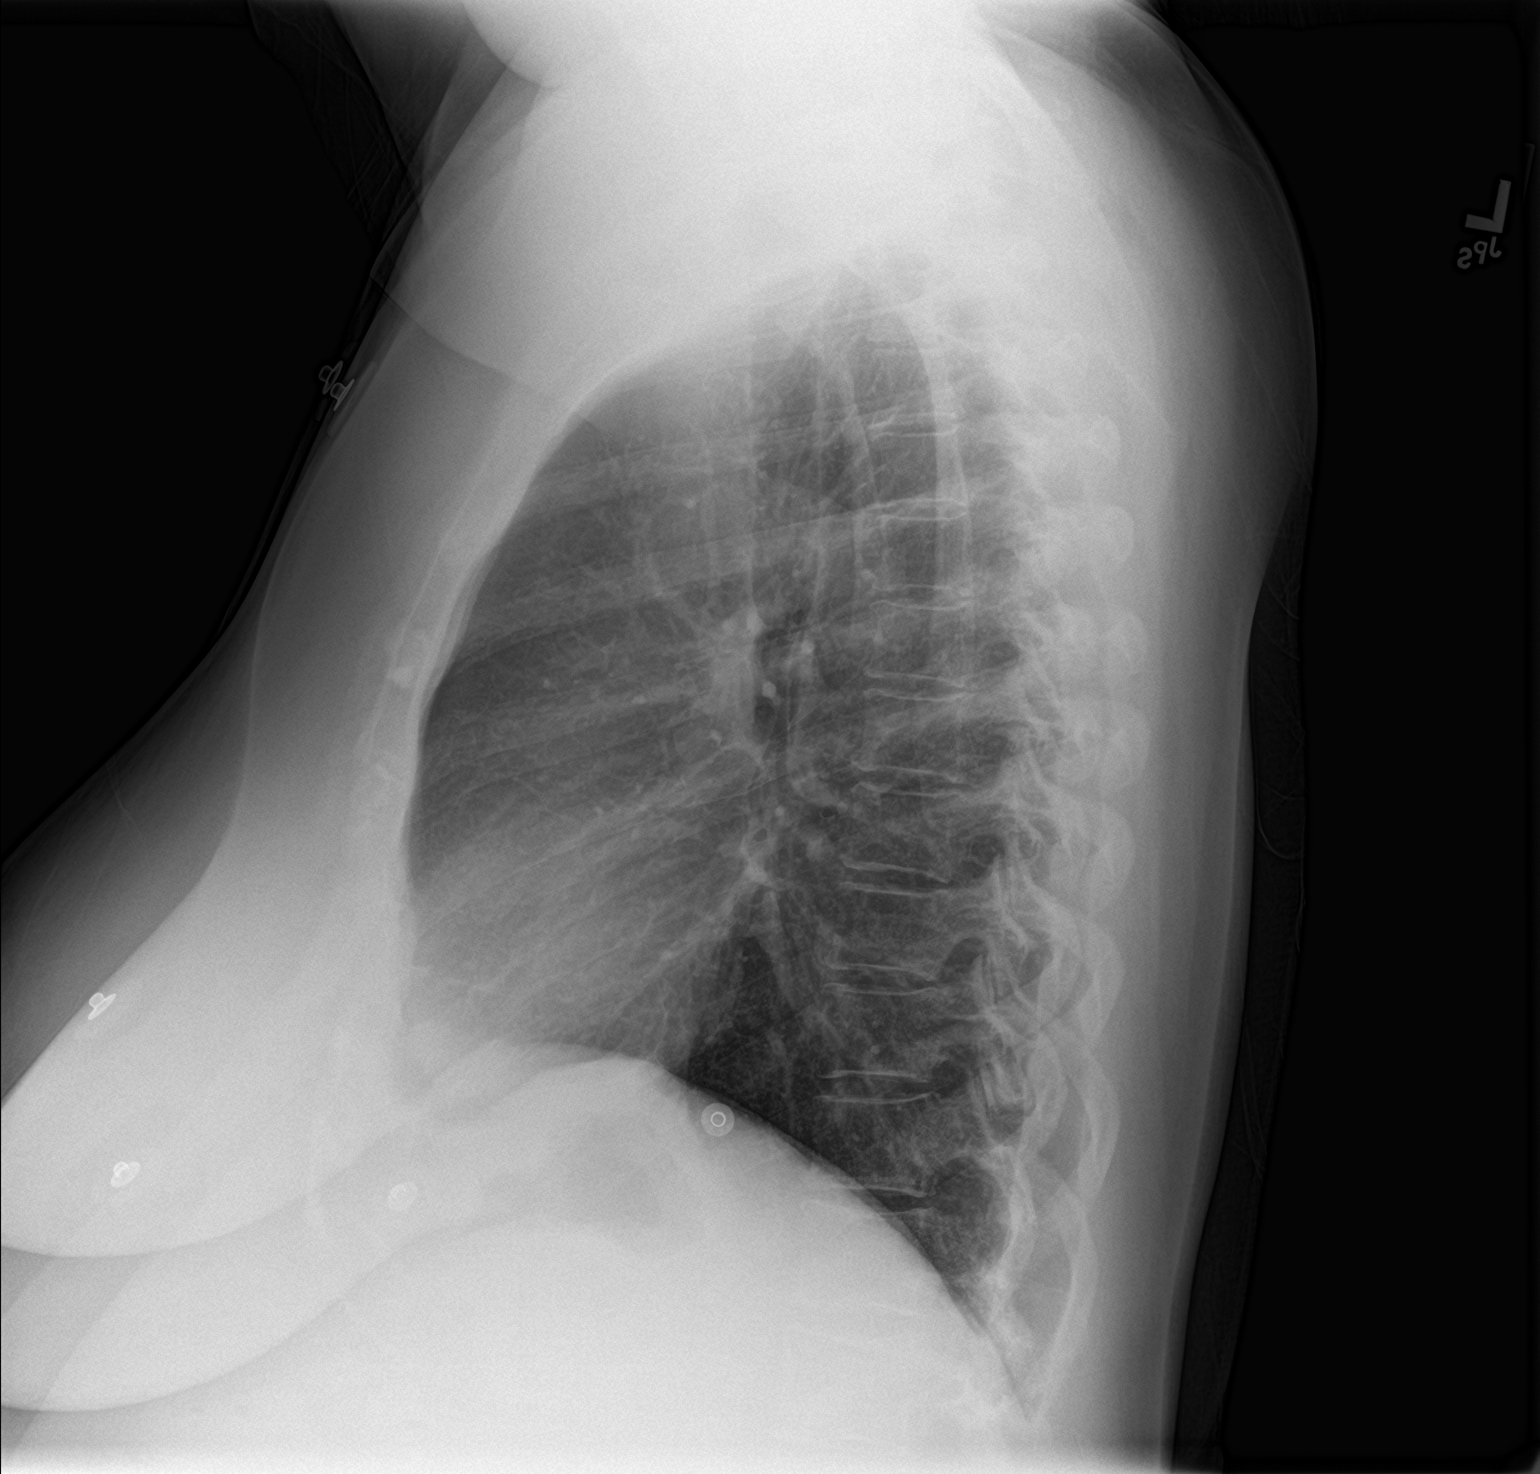

[2 of 2 positions shown; findings below may reference images not displayed]

FINDINGS: The heart size and mediastinal contours are within normal limits.
Both lungs are clear. Pleural effusion or pneumothorax. The
visualized skeletal structures are unremarkable.
IMPRESSION: No active cardiopulmonary disease.

## 2022-06-06 ENCOUNTER — Other Ambulatory Visit (HOSPITAL_BASED_OUTPATIENT_CLINIC_OR_DEPARTMENT_OTHER): Payer: Self-pay

## 2022-06-07 ENCOUNTER — Other Ambulatory Visit (HOSPITAL_BASED_OUTPATIENT_CLINIC_OR_DEPARTMENT_OTHER): Payer: Self-pay

## 2022-06-08 ENCOUNTER — Other Ambulatory Visit (HOSPITAL_BASED_OUTPATIENT_CLINIC_OR_DEPARTMENT_OTHER): Payer: Self-pay

## 2022-06-08 MED ORDER — SEMAGLUTIDE-WEIGHT MANAGEMENT 0.25 MG/0.5ML ~~LOC~~ SOAJ
0.2500 mg | SUBCUTANEOUS | 0 refills | Status: DC
  Filled 2022-06-11 (×2): qty 2, 28d supply, fill #0

## 2022-06-11 ENCOUNTER — Other Ambulatory Visit (HOSPITAL_BASED_OUTPATIENT_CLINIC_OR_DEPARTMENT_OTHER): Payer: Self-pay

## 2022-06-12 ENCOUNTER — Other Ambulatory Visit (HOSPITAL_BASED_OUTPATIENT_CLINIC_OR_DEPARTMENT_OTHER): Payer: Self-pay

## 2022-08-30 ENCOUNTER — Other Ambulatory Visit (HOSPITAL_BASED_OUTPATIENT_CLINIC_OR_DEPARTMENT_OTHER): Payer: Self-pay

## 2022-08-30 MED ORDER — WEGOVY 0.25 MG/0.5ML ~~LOC~~ SOAJ
0.2500 mg | SUBCUTANEOUS | 0 refills | Status: DC
  Filled 2022-08-30: qty 2, 28d supply, fill #0

## 2022-08-31 ENCOUNTER — Other Ambulatory Visit: Payer: Self-pay

## 2022-09-28 ENCOUNTER — Other Ambulatory Visit (HOSPITAL_BASED_OUTPATIENT_CLINIC_OR_DEPARTMENT_OTHER): Payer: Self-pay

## 2022-09-28 MED ORDER — WEGOVY 0.5 MG/0.5ML ~~LOC~~ SOAJ
0.5000 mg | SUBCUTANEOUS | 0 refills | Status: AC
  Filled 2022-09-28: qty 2, 28d supply, fill #0

## 2022-10-25 ENCOUNTER — Other Ambulatory Visit (HOSPITAL_BASED_OUTPATIENT_CLINIC_OR_DEPARTMENT_OTHER): Payer: Self-pay

## 2022-10-25 MED ORDER — WEGOVY 1 MG/0.5ML ~~LOC~~ SOAJ
1.0000 mg | SUBCUTANEOUS | 0 refills | Status: AC
  Filled 2022-10-25: qty 2, 28d supply, fill #0

## 2022-11-16 ENCOUNTER — Other Ambulatory Visit (HOSPITAL_BASED_OUTPATIENT_CLINIC_OR_DEPARTMENT_OTHER): Payer: Self-pay

## 2022-11-16 MED ORDER — WEGOVY 0.25 MG/0.5ML ~~LOC~~ SOAJ
0.2500 mg | SUBCUTANEOUS | 0 refills | Status: DC
  Filled 2022-11-16: qty 2, 28d supply, fill #0

## 2022-11-26 ENCOUNTER — Other Ambulatory Visit (HOSPITAL_BASED_OUTPATIENT_CLINIC_OR_DEPARTMENT_OTHER): Payer: Self-pay

## 2022-12-18 ENCOUNTER — Other Ambulatory Visit (HOSPITAL_BASED_OUTPATIENT_CLINIC_OR_DEPARTMENT_OTHER): Payer: Self-pay

## 2022-12-18 MED ORDER — WEGOVY 0.25 MG/0.5ML ~~LOC~~ SOAJ
0.2500 mg | SUBCUTANEOUS | 0 refills | Status: DC
  Filled 2022-12-18: qty 2, 28d supply, fill #0

## 2023-01-18 ENCOUNTER — Other Ambulatory Visit (HOSPITAL_BASED_OUTPATIENT_CLINIC_OR_DEPARTMENT_OTHER): Payer: Self-pay

## 2023-01-18 MED ORDER — WEGOVY 0.25 MG/0.5ML ~~LOC~~ SOAJ
0.2500 mg | SUBCUTANEOUS | 0 refills | Status: DC
  Filled 2023-01-18: qty 2, 28d supply, fill #0

## 2023-01-22 ENCOUNTER — Other Ambulatory Visit (HOSPITAL_BASED_OUTPATIENT_CLINIC_OR_DEPARTMENT_OTHER): Payer: Self-pay

## 2023-01-24 ENCOUNTER — Other Ambulatory Visit (HOSPITAL_BASED_OUTPATIENT_CLINIC_OR_DEPARTMENT_OTHER): Payer: Self-pay

## 2023-02-15 ENCOUNTER — Other Ambulatory Visit (HOSPITAL_BASED_OUTPATIENT_CLINIC_OR_DEPARTMENT_OTHER): Payer: Self-pay

## 2023-02-26 ENCOUNTER — Other Ambulatory Visit (HOSPITAL_BASED_OUTPATIENT_CLINIC_OR_DEPARTMENT_OTHER): Payer: Self-pay

## 2023-03-01 ENCOUNTER — Other Ambulatory Visit (HOSPITAL_BASED_OUTPATIENT_CLINIC_OR_DEPARTMENT_OTHER): Payer: Self-pay

## 2023-03-01 MED ORDER — WEGOVY 0.25 MG/0.5ML ~~LOC~~ SOAJ
0.2500 mg | SUBCUTANEOUS | 0 refills | Status: AC
  Filled 2023-03-01: qty 2, 28d supply, fill #0

## 2023-03-05 ENCOUNTER — Other Ambulatory Visit (HOSPITAL_BASED_OUTPATIENT_CLINIC_OR_DEPARTMENT_OTHER): Payer: Self-pay

## 2023-04-12 ENCOUNTER — Other Ambulatory Visit (HOSPITAL_BASED_OUTPATIENT_CLINIC_OR_DEPARTMENT_OTHER): Payer: Self-pay

## 2023-04-12 MED ORDER — WEGOVY 0.5 MG/0.5ML ~~LOC~~ SOAJ
0.5000 mg | SUBCUTANEOUS | 1 refills | Status: DC
  Filled 2023-04-12: qty 2, 28d supply, fill #0
  Filled 2023-06-24: qty 2, 28d supply, fill #1

## 2023-06-12 ENCOUNTER — Other Ambulatory Visit (HOSPITAL_BASED_OUTPATIENT_CLINIC_OR_DEPARTMENT_OTHER): Payer: Self-pay

## 2023-06-24 ENCOUNTER — Other Ambulatory Visit (HOSPITAL_BASED_OUTPATIENT_CLINIC_OR_DEPARTMENT_OTHER): Payer: Self-pay

## 2023-07-23 ENCOUNTER — Other Ambulatory Visit (HOSPITAL_BASED_OUTPATIENT_CLINIC_OR_DEPARTMENT_OTHER): Payer: Self-pay

## 2023-07-24 ENCOUNTER — Other Ambulatory Visit (HOSPITAL_BASED_OUTPATIENT_CLINIC_OR_DEPARTMENT_OTHER): Payer: Self-pay

## 2023-07-24 MED ORDER — WEGOVY 0.5 MG/0.5ML ~~LOC~~ SOAJ
0.5000 mg | SUBCUTANEOUS | 3 refills | Status: AC
  Filled 2023-07-24 – 2023-08-02 (×3): qty 2, 28d supply, fill #0

## 2023-07-29 ENCOUNTER — Other Ambulatory Visit (HOSPITAL_BASED_OUTPATIENT_CLINIC_OR_DEPARTMENT_OTHER): Payer: Self-pay

## 2023-07-31 ENCOUNTER — Other Ambulatory Visit (HOSPITAL_BASED_OUTPATIENT_CLINIC_OR_DEPARTMENT_OTHER): Payer: Self-pay

## 2023-08-02 ENCOUNTER — Other Ambulatory Visit (HOSPITAL_BASED_OUTPATIENT_CLINIC_OR_DEPARTMENT_OTHER): Payer: Self-pay

## 2023-11-01 ENCOUNTER — Ambulatory Visit (INDEPENDENT_AMBULATORY_CARE_PROVIDER_SITE_OTHER): Admitting: Podiatry

## 2023-11-01 ENCOUNTER — Ambulatory Visit (INDEPENDENT_AMBULATORY_CARE_PROVIDER_SITE_OTHER)

## 2023-11-01 DIAGNOSIS — M722 Plantar fascial fibromatosis: Secondary | ICD-10-CM

## 2023-11-01 MED ORDER — TRIAMCINOLONE ACETONIDE 10 MG/ML IJ SUSP
10.0000 mg | Freq: Once | INTRAMUSCULAR | Status: AC
  Administered 2023-11-01: 10 mg via INTRA_ARTICULAR

## 2023-11-01 MED ORDER — DICLOFENAC SODIUM 75 MG PO TBEC
75.0000 mg | DELAYED_RELEASE_TABLET | Freq: Two times a day (BID) | ORAL | 2 refills | Status: AC
Start: 2023-11-01 — End: ?

## 2023-11-01 NOTE — Patient Instructions (Signed)

## 2023-11-02 NOTE — Progress Notes (Signed)
 Subjective:   Patient ID: Melanie Shaffer, female   DOB: 53 y.o.   MRN: [id number]   HPI Patient presents with severe pain in the right heel that just started recently and she does not remember any course injury associated with it   ROS      Objective:  Physical Exam  Neurovascular status intact with patient found to have exquisite discomfort plantar aspect right heel at the insertional point of the tendon into the calcaneus with inflammation and fluid around the medial band.  Good digital perfusion well-oriented     Assessment:  Acute plantar fasciitis right with inflammation fluid of the medial band     Plan:  H&P reviewed and at this point I went ahead and I did sterile prep injected the fascia at insertion 3 mg Kenalog  5 mg Xylocaine and I applied fascial brace to elevate the arch and take stress off the insertional fascial point and advised on ice therapy and supportive shoe gear  X-rays were negative for signs of fracture or arthritis.  Did indicate small spur

## 2023-11-15 ENCOUNTER — Ambulatory Visit: Admitting: Podiatry

## 2023-11-28 ENCOUNTER — Ambulatory Visit (INDEPENDENT_AMBULATORY_CARE_PROVIDER_SITE_OTHER): Admitting: Podiatry

## 2023-11-28 ENCOUNTER — Encounter: Payer: Self-pay | Admitting: Podiatry

## 2023-11-28 VITALS — Ht 63.0 in | Wt 210.0 lb

## 2023-11-28 DIAGNOSIS — M722 Plantar fascial fibromatosis: Secondary | ICD-10-CM | POA: Diagnosis not present

## 2023-11-28 MED ORDER — TRIAMCINOLONE ACETONIDE 10 MG/ML IJ SUSP
10.0000 mg | Freq: Once | INTRAMUSCULAR | Status: AC
Start: 2023-11-28 — End: 2023-11-28
  Administered 2023-11-28: 10 mg via INTRA_ARTICULAR

## 2023-11-28 NOTE — Progress Notes (Signed)
 Subjective:   Patient ID: Melanie Shaffer, female   DOB: 53 y.o.   MRN: [id number]   HPI Patient states she is improved but she still has had some flareup of the right heel as she has started a walking regimen   ROS      Objective:  Physical Exam  Neurovascular status intact with inflammation of the right heel that is painful when pressed with improvement from previous visit     Assessment:  Improved plantar fasciitis right with fluid buildup noted     Plan:  Reviewed condition sterile prep injected the fascia at insertion 3 mg Kenalog  5 mg Xylocaine applied sterile dressing wear support shoe reappoint to recheck as symptoms indicate

## 2024-02-10 ENCOUNTER — Ambulatory Visit (INDEPENDENT_AMBULATORY_CARE_PROVIDER_SITE_OTHER): Payer: Self-pay | Admitting: Podiatry

## 2024-02-10 DIAGNOSIS — Z91199 Patient's noncompliance with other medical treatment and regimen due to unspecified reason: Secondary | ICD-10-CM

## 2024-02-10 NOTE — Progress Notes (Signed)
Patient did not come in for scheduled appointment.

## 2024-04-22 ENCOUNTER — Encounter: Payer: Self-pay | Admitting: Podiatry

## 2024-04-22 ENCOUNTER — Ambulatory Visit: Admitting: Podiatry

## 2024-04-22 DIAGNOSIS — M722 Plantar fascial fibromatosis: Secondary | ICD-10-CM

## 2024-04-22 MED ORDER — TRIAMCINOLONE ACETONIDE 10 MG/ML IJ SUSP
10.0000 mg | Freq: Once | INTRAMUSCULAR | Status: AC
  Administered 2024-04-22: 10 mg via INTRA_ARTICULAR

## 2024-04-22 NOTE — Progress Notes (Signed)
 Subjective:   Patient ID: Melanie Shaffer, female   DOB: 54 y.o.   MRN: [id number]   HPI Patient presents stating that she has an area that is hurting her again in her right heel and she is trying to be more active currently   ROS      Objective:  Physical Exam  Neuro vascular status intact inflammation slightly more distal around the plantar fascia than previous sore when walking with moderate swelling     Assessment:  Acute reoccurrence of fasciitis symptoms right with inflammation slightly more distal     Plan:  Discussed the continuance of inserts that she is wearing today I did sterile prep I injected into the plantar fascia slightly distal 3 mg Kenalog  5 mg Xylocaine applied sterile dressing will be seen back as needed may require custom orthotics if symptoms persist
